# Patient Record
Sex: Female | Born: 1950 | Race: White | Hispanic: No | Marital: Married
Health system: Southern US, Community
[De-identification: ages and names within clinical notes are randomized; demographics above are authoritative.]

---

## 2018-04-15 ENCOUNTER — Inpatient Hospital Stay
Admission: AD | Admit: 2018-04-15 | Discharge: 2018-05-10 | Disposition: A | Discharge status: Home / Self Care | Payer: Medicaid Other | Source: Ambulatory Visit | Attending: Internal Medicine | Admitting: Internal Medicine

## 2018-04-15 ENCOUNTER — Other Ambulatory Visit (HOSPITAL_COMMUNITY): Payer: Medicaid Other

## 2018-04-15 DIAGNOSIS — K567 Ileus, unspecified: Secondary | ICD-10-CM

## 2018-04-15 DIAGNOSIS — L0291 Cutaneous abscess, unspecified: Secondary | ICD-10-CM

## 2018-04-15 DIAGNOSIS — R0602 Shortness of breath: Secondary | ICD-10-CM

## 2018-04-15 DIAGNOSIS — Z4659 Encounter for fitting and adjustment of other gastrointestinal appliance and device: Secondary | ICD-10-CM

## 2018-04-15 LAB — C DIFFICILE QUICK SCREEN W PCR REFLEX
C DIFFICLE (CDIFF) ANTIGEN: NEGATIVE
C Diff interpretation: NOT DETECTED
C Diff toxin: NEGATIVE

## 2018-04-16 LAB — COMPREHENSIVE METABOLIC PANEL
ALT: 23 U/L (ref 0–44)
AST: 35 U/L (ref 15–41)
Albumin: 1.3 g/dL — ABNORMAL LOW (ref 3.5–5.0)
Alkaline Phosphatase: 119 U/L (ref 38–126)
Anion gap: 9 (ref 5–15)
BUN: 9 mg/dL (ref 8–23)
CHLORIDE: 102 mmol/L (ref 98–111)
CO2: 27 mmol/L (ref 22–32)
CREATININE: 0.65 mg/dL (ref 0.44–1.00)
Calcium: 7.5 mg/dL — ABNORMAL LOW (ref 8.9–10.3)
GFR calc Af Amer: >60 mL/min (ref >60)
GFR calc non Af Amer: >60 mL/min (ref >60)
Glucose, Bld: 130 mg/dL — ABNORMAL HIGH (ref 70–99)
Potassium: 2.9 mmol/L — ABNORMAL LOW (ref 3.5–5.1)
Sodium: 138 mmol/L (ref 135–145)
Total Bilirubin: 0.4 mg/dL (ref 0.3–1.2)
Total Protein: 5.3 g/dL — ABNORMAL LOW (ref 6.5–8.1)

## 2018-04-16 LAB — CBC WITH DIFFERENTIAL/PLATELET
Abs Immature Granulocytes: 0.06 10*3/uL (ref 0.00–0.07)
Basophils Absolute: 0 10*3/uL (ref 0.0–0.1)
Basophils Relative: 0 %
Eosinophils Absolute: 0 10*3/uL (ref 0.0–0.5)
Eosinophils Relative: 1 %
HCT: 29 % — ABNORMAL LOW (ref 36.0–46.0)
Hemoglobin: 8.7 g/dL — ABNORMAL LOW (ref 12.0–15.0)
Immature Granulocytes: 1 %
Lymphocytes Relative: 33 %
Lymphs Abs: 2.9 10*3/uL (ref 0.7–4.0)
MCH: 28 pg (ref 26.0–34.0)
MCHC: 30 g/dL (ref 30.0–36.0)
MCV: 93.2 fL (ref 80.0–100.0)
Monocytes Absolute: 1.1 10*3/uL — ABNORMAL HIGH (ref 0.1–1.0)
Monocytes Relative: 13 %
Neutro Abs: 4.7 10*3/uL (ref 1.7–7.7)
Neutrophils Relative %: 52 %
Platelets: 185 10*3/uL (ref 150–400)
RBC: 3.11 MIL/uL — ABNORMAL LOW (ref 3.87–5.11)
RDW: 17.2 % — ABNORMAL HIGH (ref 11.5–15.5)
WBC: 8.9 10*3/uL (ref 4.0–10.5)
nRBC: 0 % (ref 0.0–0.2)

## 2018-04-16 LAB — HEMOGLOBIN A1C
Hgb A1c MFr Bld: 5.4 % (ref 4.8–5.6)
Mean Plasma Glucose: 108.28 mg/dL

## 2018-04-16 LAB — MAGNESIUM: Magnesium: 1.7 mg/dL (ref 1.7–2.4)

## 2018-04-16 MED ORDER — CLONAZEPAM 1 MG PO TABS
1.00 | ORAL_TABLET | ORAL | Status: DC
Start: ? — End: 2018-04-16

## 2018-04-16 MED ORDER — ENOXAPARIN SODIUM 40 MG/0.4ML ~~LOC~~ SOLN
.60 | SUBCUTANEOUS | Status: DC
Start: 2018-04-15 — End: 2018-04-16

## 2018-04-16 MED ORDER — THERA-M PO TABS
1.00 | ORAL_TABLET | ORAL | Status: DC
Start: 2018-04-16 — End: 2018-04-16

## 2018-04-16 MED ORDER — BUDESONIDE-FORMOTEROL FUMARATE 80-4.5 MCG/ACT IN AERO
2.00 | INHALATION_SPRAY | RESPIRATORY_TRACT | Status: DC
Start: 2018-04-15 — End: 2018-04-16

## 2018-04-16 MED ORDER — FLUOXETINE HCL 40 MG PO CAPS
80.00 | ORAL_CAPSULE | ORAL | Status: DC
Start: 2018-04-16 — End: 2018-04-16

## 2018-04-16 MED ORDER — GENERIC EXTERNAL MEDICATION
Status: DC
Start: 2018-04-15 — End: 2018-04-16

## 2018-04-16 MED ORDER — GABAPENTIN 300 MG PO CAPS
300.00 | ORAL_CAPSULE | ORAL | Status: DC
Start: 2018-04-15 — End: 2018-04-16

## 2018-04-16 MED ORDER — GENERIC EXTERNAL MEDICATION
500.00 | Status: DC
Start: ? — End: 2018-04-16

## 2018-04-16 MED ORDER — PANTOPRAZOLE SODIUM 40 MG PO TBEC
40.00 | DELAYED_RELEASE_TABLET | ORAL | Status: DC
Start: ? — End: 2018-04-16

## 2018-04-16 MED ORDER — PANTOPRAZOLE SODIUM 40 MG PO TBEC
40.00 | DELAYED_RELEASE_TABLET | ORAL | Status: DC
Start: 2018-04-15 — End: 2018-04-16

## 2018-04-16 MED ORDER — THIAMINE HCL 100 MG PO TABS
100.00 | ORAL_TABLET | ORAL | Status: DC
Start: 2018-04-16 — End: 2018-04-16

## 2018-04-16 MED ORDER — ATORVASTATIN CALCIUM 40 MG PO TABS
80.00 | ORAL_TABLET | ORAL | Status: DC
Start: 2018-04-16 — End: 2018-04-16

## 2018-04-16 MED ORDER — ACETAMINOPHEN 500 MG PO TABS
1000.00 | ORAL_TABLET | ORAL | Status: DC
Start: ? — End: 2018-04-16

## 2018-04-16 MED ORDER — METOCLOPRAMIDE HCL 5 MG/ML IJ SOLN
10.00 | INTRAMUSCULAR | Status: DC
Start: 2018-04-15 — End: 2018-04-16

## 2018-04-16 MED ORDER — BACLOFEN 10 MG PO TABS
10.00 | ORAL_TABLET | ORAL | Status: DC
Start: 2018-04-15 — End: 2018-04-16

## 2018-04-16 MED ORDER — ROPINIROLE HCL 0.5 MG PO TABS
0.50 | ORAL_TABLET | ORAL | Status: DC
Start: 2018-04-15 — End: 2018-04-16

## 2018-04-16 MED ORDER — ONDANSETRON HCL 4 MG/2ML IJ SOLN
4.00 | INTRAMUSCULAR | Status: DC
Start: ? — End: 2018-04-16

## 2018-04-16 MED ORDER — TOPIRAMATE 25 MG PO TABS
25.00 | ORAL_TABLET | ORAL | Status: DC
Start: 2018-04-15 — End: 2018-04-16

## 2018-04-16 MED ORDER — MELATONIN 3 MG PO TABS
3.00 | ORAL_TABLET | ORAL | Status: DC
Start: 2018-04-15 — End: 2018-04-16

## 2018-04-16 MED ORDER — GENERIC EXTERNAL MEDICATION
12.50 | Status: DC
Start: ? — End: 2018-04-16

## 2018-04-16 MED ORDER — AMIODARONE HCL 200 MG PO TABS
200.00 | ORAL_TABLET | ORAL | Status: DC
Start: 2018-04-16 — End: 2018-04-16

## 2018-04-17 LAB — POTASSIUM: Potassium: 3.4 mmol/L — ABNORMAL LOW (ref 3.5–5.1)

## 2018-04-19 LAB — BASIC METABOLIC PANEL
Anion gap: 12 (ref 5–15)
BUN: 7 mg/dL — ABNORMAL LOW (ref 8–23)
CHLORIDE: 101 mmol/L (ref 98–111)
CO2: 23 mmol/L (ref 22–32)
Calcium: 7.8 mg/dL — ABNORMAL LOW (ref 8.9–10.3)
Creatinine, Ser: 0.66 mg/dL (ref 0.44–1.00)
GFR calc Af Amer: >60 mL/min (ref >60)
GFR calc non Af Amer: >60 mL/min (ref >60)
Glucose, Bld: 102 mg/dL — ABNORMAL HIGH (ref 70–99)
Potassium: 3.4 mmol/L — ABNORMAL LOW (ref 3.5–5.1)
Sodium: 136 mmol/L (ref 135–145)

## 2018-04-22 LAB — BASIC METABOLIC PANEL
Anion gap: 10 (ref 5–15)
BUN: 10 mg/dL (ref 8–23)
CALCIUM: 7.9 mg/dL — AB (ref 8.9–10.3)
CHLORIDE: 102 mmol/L (ref 98–111)
CO2: 26 mmol/L (ref 22–32)
CREATININE: 0.63 mg/dL (ref 0.44–1.00)
GFR calc Af Amer: >60 mL/min (ref >60)
GFR calc non Af Amer: >60 mL/min (ref >60)
Glucose, Bld: 91 mg/dL (ref 70–99)
Potassium: 2.9 mmol/L — ABNORMAL LOW (ref 3.5–5.1)
SODIUM: 138 mmol/L (ref 135–145)

## 2018-04-23 LAB — POTASSIUM: Potassium: 3.8 mmol/L (ref 3.5–5.1)

## 2018-04-24 ENCOUNTER — Other Ambulatory Visit (HOSPITAL_COMMUNITY): Payer: Medicaid Other

## 2018-04-26 LAB — BASIC METABOLIC PANEL
Anion gap: 10 (ref 5–15)
BUN: 7 mg/dL — ABNORMAL LOW (ref 8–23)
CO2: 25 mmol/L (ref 22–32)
Calcium: 8 mg/dL — ABNORMAL LOW (ref 8.9–10.3)
Chloride: 104 mmol/L (ref 98–111)
Creatinine, Ser: 0.66 mg/dL (ref 0.44–1.00)
GFR calc Af Amer: >60 mL/min (ref >60)
Glucose, Bld: 84 mg/dL (ref 70–99)
POTASSIUM: 3.3 mmol/L — AB (ref 3.5–5.1)
Sodium: 139 mmol/L (ref 135–145)

## 2018-04-26 LAB — CBC
HCT: 31.8 % — ABNORMAL LOW (ref 36.0–46.0)
Hemoglobin: 9.1 g/dL — ABNORMAL LOW (ref 12.0–15.0)
MCH: 27.6 pg (ref 26.0–34.0)
MCHC: 28.6 g/dL — ABNORMAL LOW (ref 30.0–36.0)
MCV: 96.4 fL (ref 80.0–100.0)
Platelets: 222 10*3/uL (ref 150–400)
RBC: 3.3 MIL/uL — ABNORMAL LOW (ref 3.87–5.11)
RDW: 18 % — ABNORMAL HIGH (ref 11.5–15.5)
WBC: 7.2 10*3/uL (ref 4.0–10.5)
nRBC: 0 % (ref 0.0–0.2)

## 2018-04-26 LAB — C DIFFICILE QUICK SCREEN W PCR REFLEX
C Diff antigen: NEGATIVE
C Diff interpretation: NOT DETECTED
C Diff toxin: NEGATIVE

## 2018-04-27 ENCOUNTER — Institutional Professional Consult (permissible substitution) (HOSPITAL_COMMUNITY): Payer: Medicaid Other

## 2018-04-27 MED ORDER — IOHEXOL 300 MG/ML  SOLN
100.0000 mL | Freq: Once | INTRAMUSCULAR | Status: AC | PRN
  Administered 2018-04-27: 100 mL via INTRAVENOUS

## 2018-04-29 NOTE — Progress Notes (Signed)
Patient ID: Mackenzie HittLavonne B Horne, female   DOB: [birth year], 67 y.o.   MRN: [id number]   Request made for IR to evaluate for possible abscess drain placement  Pt with Nausea and Abd pain CT Yesterday:  IMPRESSION: 1. Patient with outside imaging, not available for direct review. Appropriate clinical history also not available. 2. Peripherally enhancing fluid collections in the left subdiaphragmatic region and both rectus sheaths, sterility indeterminate. 3. Irregular gastric wall thickening. Irregular fluid either within the gastric wall or immediately adjacent to the gastric fundus. 4. Small subcapsular fluid collection adjacent to the right lobe of the liver measuring up to 1 cm in depth, sterility indeterminate. 5. Heterogeneous central mesenteric mass with additional enhancing nodules at the root of the small bowel mesentery, patient with reported history of neuroendocrine lesions. Additional enhancing lesions noted in the pelvis. 6. 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 Small bowel wall thickening involving a lower abdominopelvic bowel loops may be due to venous congestion. Similar bowel wall thickening seen at the transverse colon. 7. Moderate left pleural effusion with adjacent compressive atelectasis. 8. Possible enhancing liver lesions, not well assessed due to phase of contrast. 9.  Aortic Atherosclerosis (ICD10-I70.0).  Dr Lowella DandyHenn has reviewed imaging Notes are several very small collections throughout abdomen and abd wall Presumably post op NO collection is large enough for asp/drain placement Pt with wbc wnl Rec: watch for signs of sepsis Consider treatment of collections with antibiotic coverage Rescan in 5-7 days if symptoms continue or worsen   Dr Manson PasseyBrown aware

## 2018-04-30 ENCOUNTER — Other Ambulatory Visit (HOSPITAL_COMMUNITY): Payer: Medicaid Other

## 2018-05-03 LAB — COMPREHENSIVE METABOLIC PANEL
ALT: 15 U/L (ref 0–44)
AST: 28 U/L (ref 15–41)
Albumin: 1.6 g/dL — ABNORMAL LOW (ref 3.5–5.0)
Alkaline Phosphatase: 113 U/L (ref 38–126)
Anion gap: 9 (ref 5–15)
BUN: 5 mg/dL — AB (ref 8–23)
CALCIUM: 7.8 mg/dL — AB (ref 8.9–10.3)
CO2: 25 mmol/L (ref 22–32)
CREATININE: 0.7 mg/dL (ref 0.44–1.00)
Chloride: 104 mmol/L (ref 98–111)
GFR calc Af Amer: >60 mL/min (ref >60)
GFR calc non Af Amer: >60 mL/min (ref >60)
Glucose, Bld: 86 mg/dL (ref 70–99)
Potassium: 2.5 mmol/L — CL (ref 3.5–5.1)
Sodium: 138 mmol/L (ref 135–145)
Total Bilirubin: 0.5 mg/dL (ref 0.3–1.2)
Total Protein: 5.2 g/dL — ABNORMAL LOW (ref 6.5–8.1)

## 2018-05-03 LAB — CBC
HCT: 29.4 % — ABNORMAL LOW (ref 36.0–46.0)
HEMOGLOBIN: 9.1 g/dL — AB (ref 12.0–15.0)
MCH: 29.4 pg (ref 26.0–34.0)
MCHC: 31 g/dL (ref 30.0–36.0)
MCV: 95.1 fL (ref 80.0–100.0)
NRBC: 0 % (ref 0.0–0.2)
Platelets: 217 10*3/uL (ref 150–400)
RBC: 3.09 MIL/uL — ABNORMAL LOW (ref 3.87–5.11)
RDW: 18.8 % — ABNORMAL HIGH (ref 11.5–15.5)
WBC: 6.7 10*3/uL (ref 4.0–10.5)

## 2018-05-03 LAB — POTASSIUM: Potassium: 4.3 mmol/L (ref 3.5–5.1)

## 2018-05-03 LAB — MAGNESIUM: Magnesium: 1.6 mg/dL — ABNORMAL LOW (ref 1.7–2.4)

## 2018-05-04 LAB — BASIC METABOLIC PANEL
Anion gap: 9 (ref 5–15)
BUN: <5 mg/dL — ABNORMAL LOW (ref 8–23)
CO2: 24 mmol/L (ref 22–32)
Calcium: 8 mg/dL — ABNORMAL LOW (ref 8.9–10.3)
Chloride: 102 mmol/L (ref 98–111)
Creatinine, Ser: 0.73 mg/dL (ref 0.44–1.00)
Glucose, Bld: 78 mg/dL (ref 70–99)
Potassium: 4.2 mmol/L (ref 3.5–5.1)
Sodium: 135 mmol/L (ref 135–145)

## 2018-05-04 LAB — MAGNESIUM: Magnesium: 2 mg/dL (ref 1.7–2.4)

## 2019-01-11 DEATH — deceased

## 2020-03-17 IMAGING — DX DG ABD PORTABLE 1V
1 series · 1 of 1 positions shown · non-contrast
Comparison: No prior.

CLINICAL DATA: Nasogastric tube placement.

EXAM:
PORTABLE ABDOMEN - 1 VIEW

[abdomen kub]
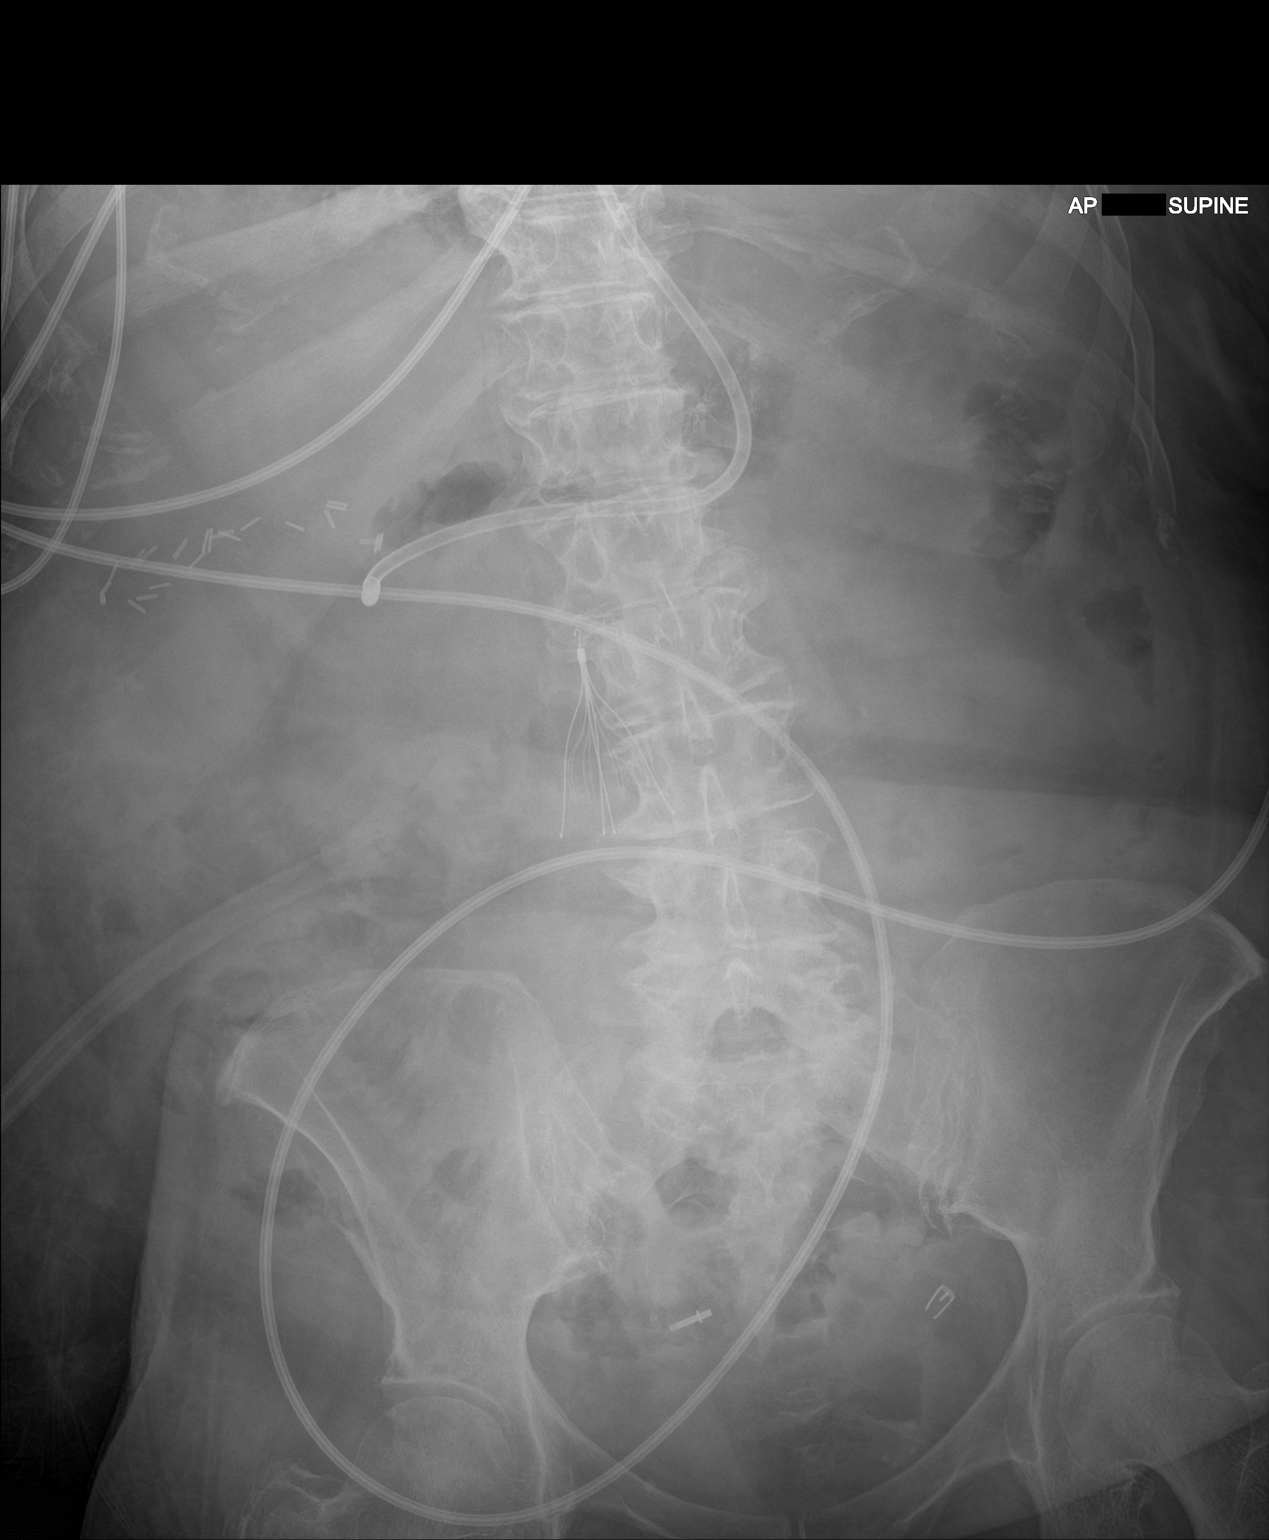

[1 of 1 positions shown; findings below may reference images not displayed]

FINDINGS: Nasogastric tube noted with tip over the distal stomach. Surgical
tubing noted over the right lower abdomen. Surgical clips right
upper quadrant. IVC filter noted with tip at the L1-L2 disc space
level. Bilateral tubal ligations. No bowel distention. Several
nondilated air-filled loops of small bowel noted. This is
nonspecific. No free air. Degenerative changes lumbar spine and both
hips.
IMPRESSION: 1.  NG tube noted with tip over the distal stomach.

2. Postsurgical changes as above. Several air-filled loops of
nondilated small bowel noted. This is nonspecific finding. No bowel
distention or free air noted.

## 2020-03-29 IMAGING — CT CT ABD-PELV W/ CM
2 of 5 series · 14 of 46 positions shown, 16 images · IV contrast (APPLIED)
Comparison: Radiograph 03/25/2018.

CLINICAL DATA: Unspecified abdominal pain. No additional history
provided.

EXAM:
CT ABDOMEN AND PELVIS WITH CONTRAST
TECHNIQUE: Multidetector CT imaging of the abdomen and pelvis was performed
using the standard protocol following bolus administration of
intravenous contrast.
CONTRAST:  100mL OMNIPAQUE IOHEXOL 300 MG/ML  SOLN

[Series 3: abd/ pelvis 5.0 i30f 2 · axial · 0.91mm/px · z∈[-638,-214]mm · 11 of 96 slices shown, 13 images]
[im 6/96  soft-tissue]
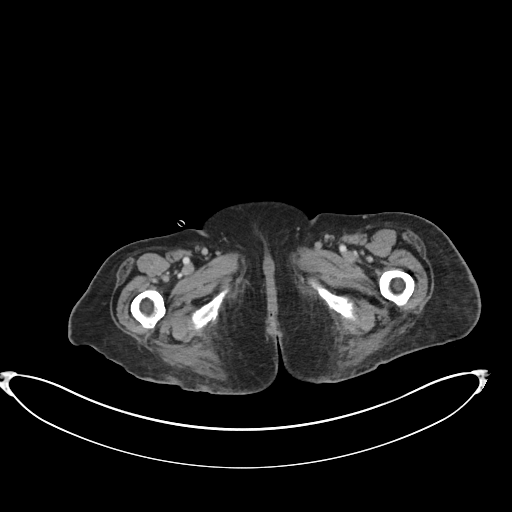
[im 6/96  bone]
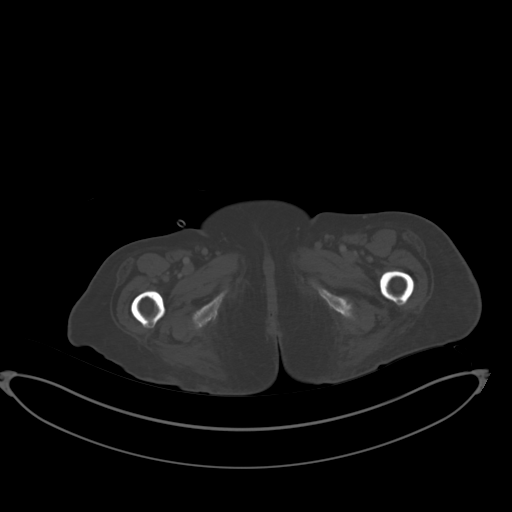
[im 16/96  soft-tissue]
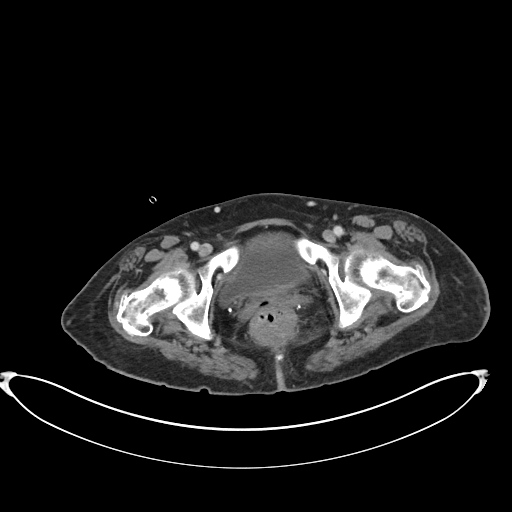
[im 26/96  soft-tissue]
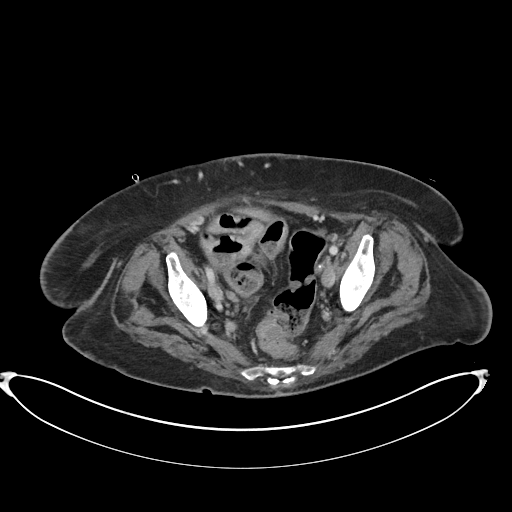
[im 31/96  soft-tissue]
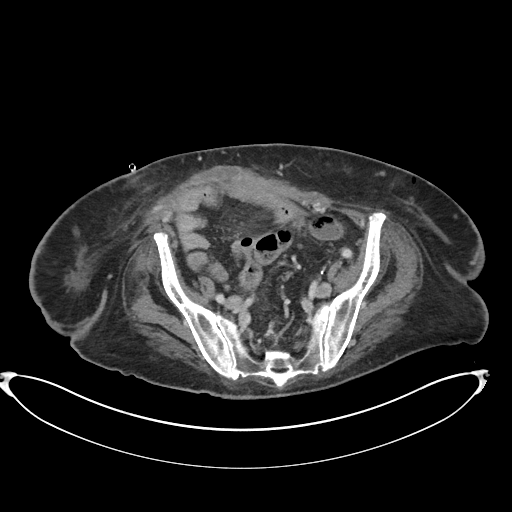
[im 41/96  soft-tissue]
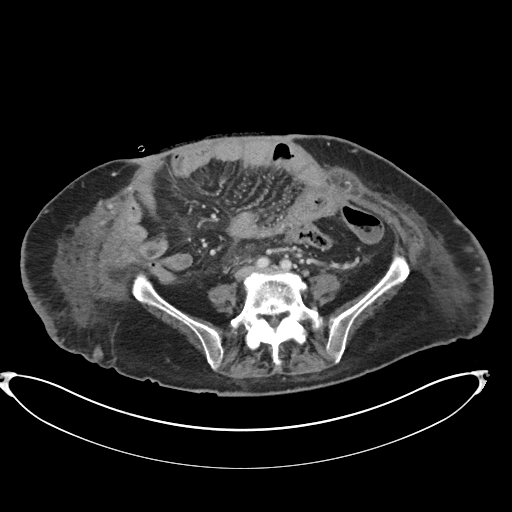
[im 51/96  soft-tissue]
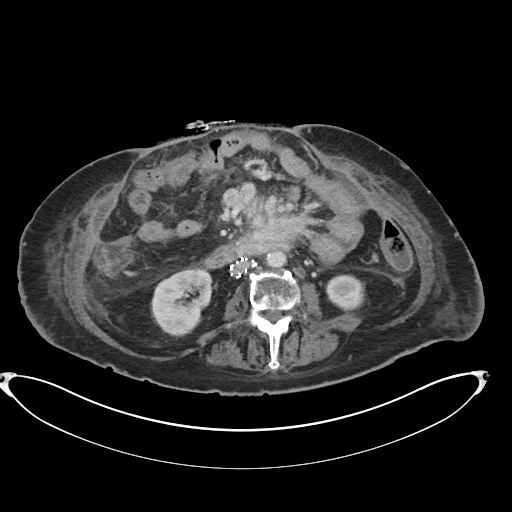
[im 56/96  soft-tissue]
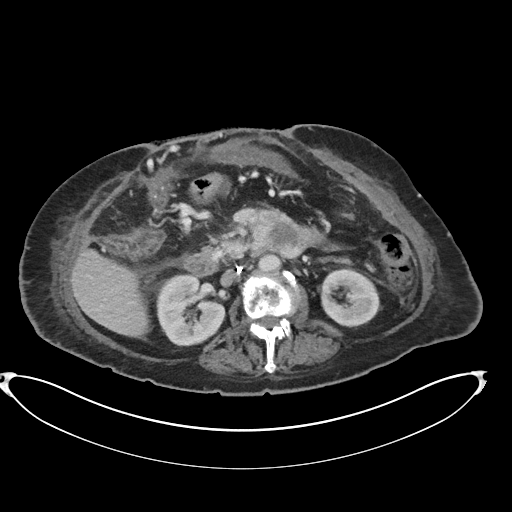
[im 66/96  soft-tissue]
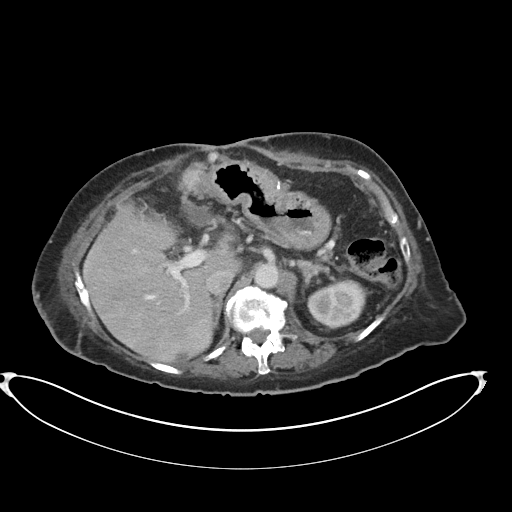
[im 71/96  soft-tissue]
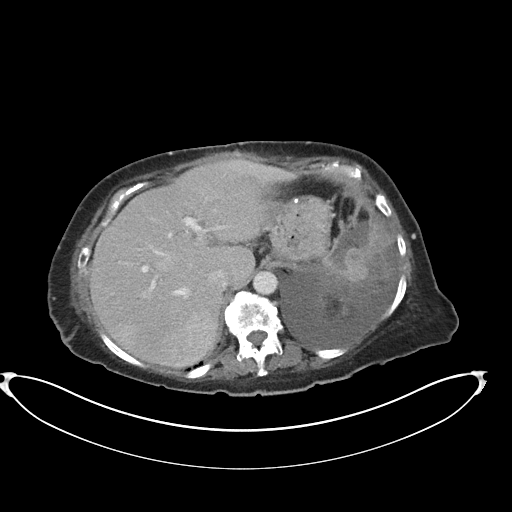
[im 71/96  bone]
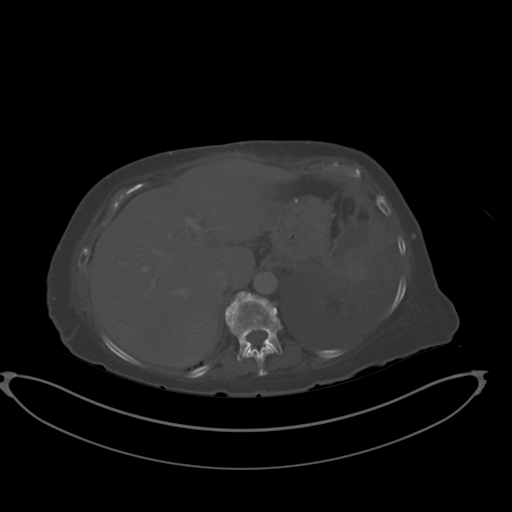
[im 81/96  soft-tissue]
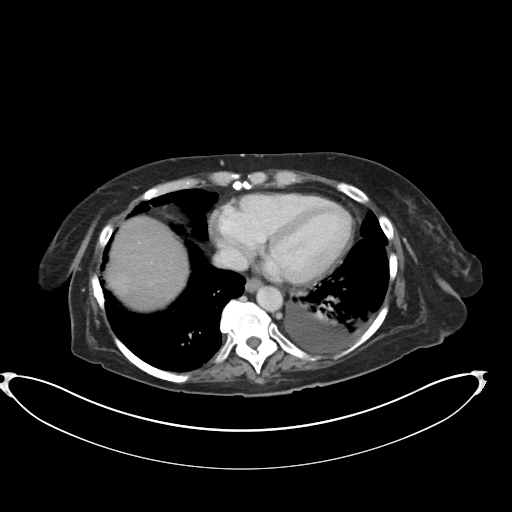
[im 91/96  soft-tissue]
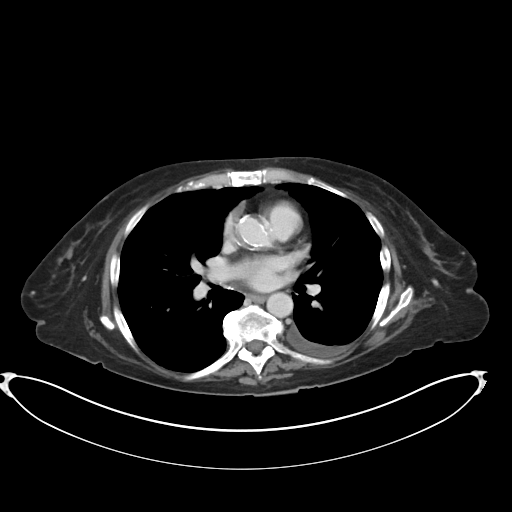

[Series 6: coronal soft tissue · coronal · 0.88mm/px · 3 of 85 slices shown]
[im 29/85  soft-tissue]
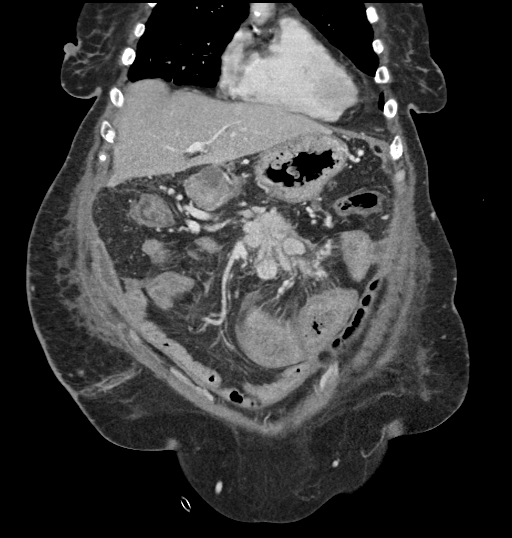
[im 38/85  soft-tissue]
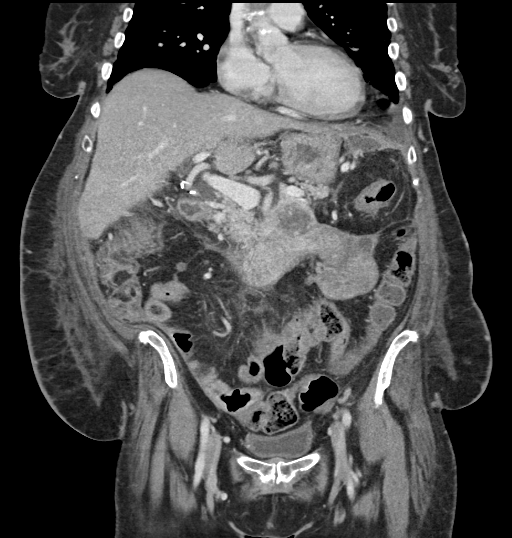
[im 47/85  soft-tissue]
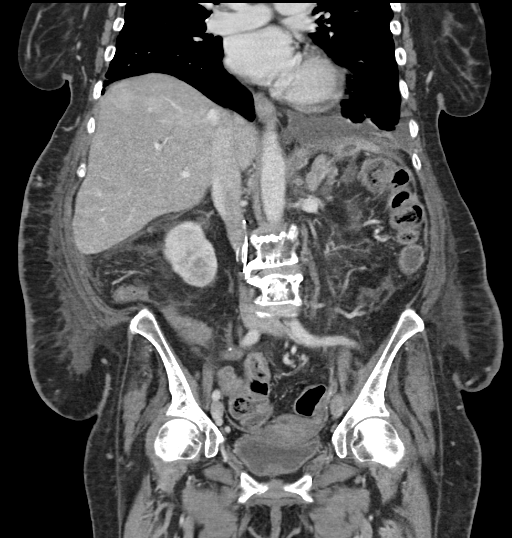

[14 of 46 positions shown; findings below may reference images not displayed]

Report from chest, abdomen, and
pelvis CT 03/21/2018 from [email or web address] Those
images are not available.
FINDINGS: Lower chest: Moderate left pleural effusion with compressive
atelectasis. Patchy ground-glass and reticulonodular opacities in
the right lower and middle lobe. Punctate calcified granuloma right
lower lobe.

Hepatobiliary: Heterogeneous attenuation to the liver with possible
areas of hypervascular lesions particularly in the dome, for example
enhancing focus measuring 18 mm image 19 series 3. Small subcapsular
fluid collection about the inferior medial right lobe measures up to
1 cm, sterility indeterminate. Prior cholecystectomy.

Pancreas: Parenchymal atrophy.  No acute inflammation.

Spleen: Absent.

Adrenals/Urinary Tract: Bilateral adrenal thickening without
dominant nodule. No hydronephrosis or perinephric edema. Homogeneous
renal enhancement with symmetric excretion on delayed phase imaging.
Tiny low-density lesions throughout both kidneys are too small to
characterize. Urinary bladder is partially distended without wall
thickening.

Stomach/Bowel: Enteric suture line in the stomach, irregular gastric
wall thickening distally. Low-density fluid collection either within
or urine to the gastric fundus, images 32-33 series 3. Slight
duodenal retraction secondary to central mesenteric mass. Wall
thickening with perienteric edema involving small bowel in the
central abdomen that is opposed to the open anterior abdominal wall,
probable adhesions. There is colonic wall thickening of the
transverse colon.

Vascular/Lymphatic: IVC filter in place. Superior mesenteric vein
may be occluded due to central mesenteric mass, there are numerous
mesenteric collaterals. Mild aorta bi-iliac atherosclerosis without
aneurysm.

Reproductive: Uterus and adnexa are unremarkable. Bilateral tubal
ligation clips.

Other: Heterogeneous central mesenteric mass measures approximately
2.9 x 3.3 cm and abuts the superior mesenteric artery. Additional
small mesenteric nodules extend into the root of the small bowel
mesentery. There is an enhancing nodule in the right pelvis
measuring 11 mm, image 74 series 3. Tiny enhancing focus in the mid
pelvis image 72 series 3, exact location difficult to delineate.
Mesenteric edema and free fluid. There is a peripherally enhancing
left subdiaphragmatic fluid collection that is irregular in shape
measuring 5.2 x 2.3 x 4.4 cm, no definite internal air. No
pneumoperitoneum or free air. Open anterior abdominal wall with
overlying dressing in place.

Musculoskeletal: Peripherally enhancing fluid collection in the
right rectus sheath measures 3.3 x 1.3 x 4.4 cm. Fluid collection in
the left rectus sheath demonstrate lesser peripheral enhancement and
measures 4.4 x 1.2 x 4.3 cm. Subacute fractures of the sternum and
bilateral ribs. Multilevel degenerative change in the lumbar spine.
IMPRESSION: 1. Patient with outside imaging, not available for direct review.
Appropriate clinical history also not available.
2. Peripherally enhancing fluid collections in the left
subdiaphragmatic region and both rectus sheaths, sterility
indeterminate.
3. Irregular gastric wall thickening. Irregular fluid either within
the gastric wall or immediately adjacent to the gastric fundus.
4. Small subcapsular fluid collection adjacent to the right lobe of
the liver measuring up to 1 cm in depth, sterility indeterminate.
5. Heterogeneous central mesenteric mass with additional enhancing
nodules at the root of the small bowel mesentery, patient with
reported history of neuroendocrine lesions. Additional enhancing
lesions noted in the pelvis.
6. Small bowel mass abuts the superior mesenteric artery, and may
obstruct the superior mesenteric vein with multiple mesenteric
collaterals. Small bowel wall thickening involving a lower
abdominopelvic bowel loops may be due to venous congestion. Similar
bowel wall thickening seen at the transverse colon.
7. Moderate left pleural effusion with adjacent compressive
atelectasis.
8. Possible enhancing liver lesions, not well assessed due to phase
of contrast.
9.  Aortic Atherosclerosis ([id number]).

## 2020-04-01 IMAGING — DX DG ABD PORTABLE 1V
1 series · 1 of 1 positions shown · non-contrast
Comparison: CT 04/27/2018, radiograph 04/24/2018

CLINICAL DATA: Ileus

EXAM:
PORTABLE ABDOMEN - 1 VIEW

[abdomen]
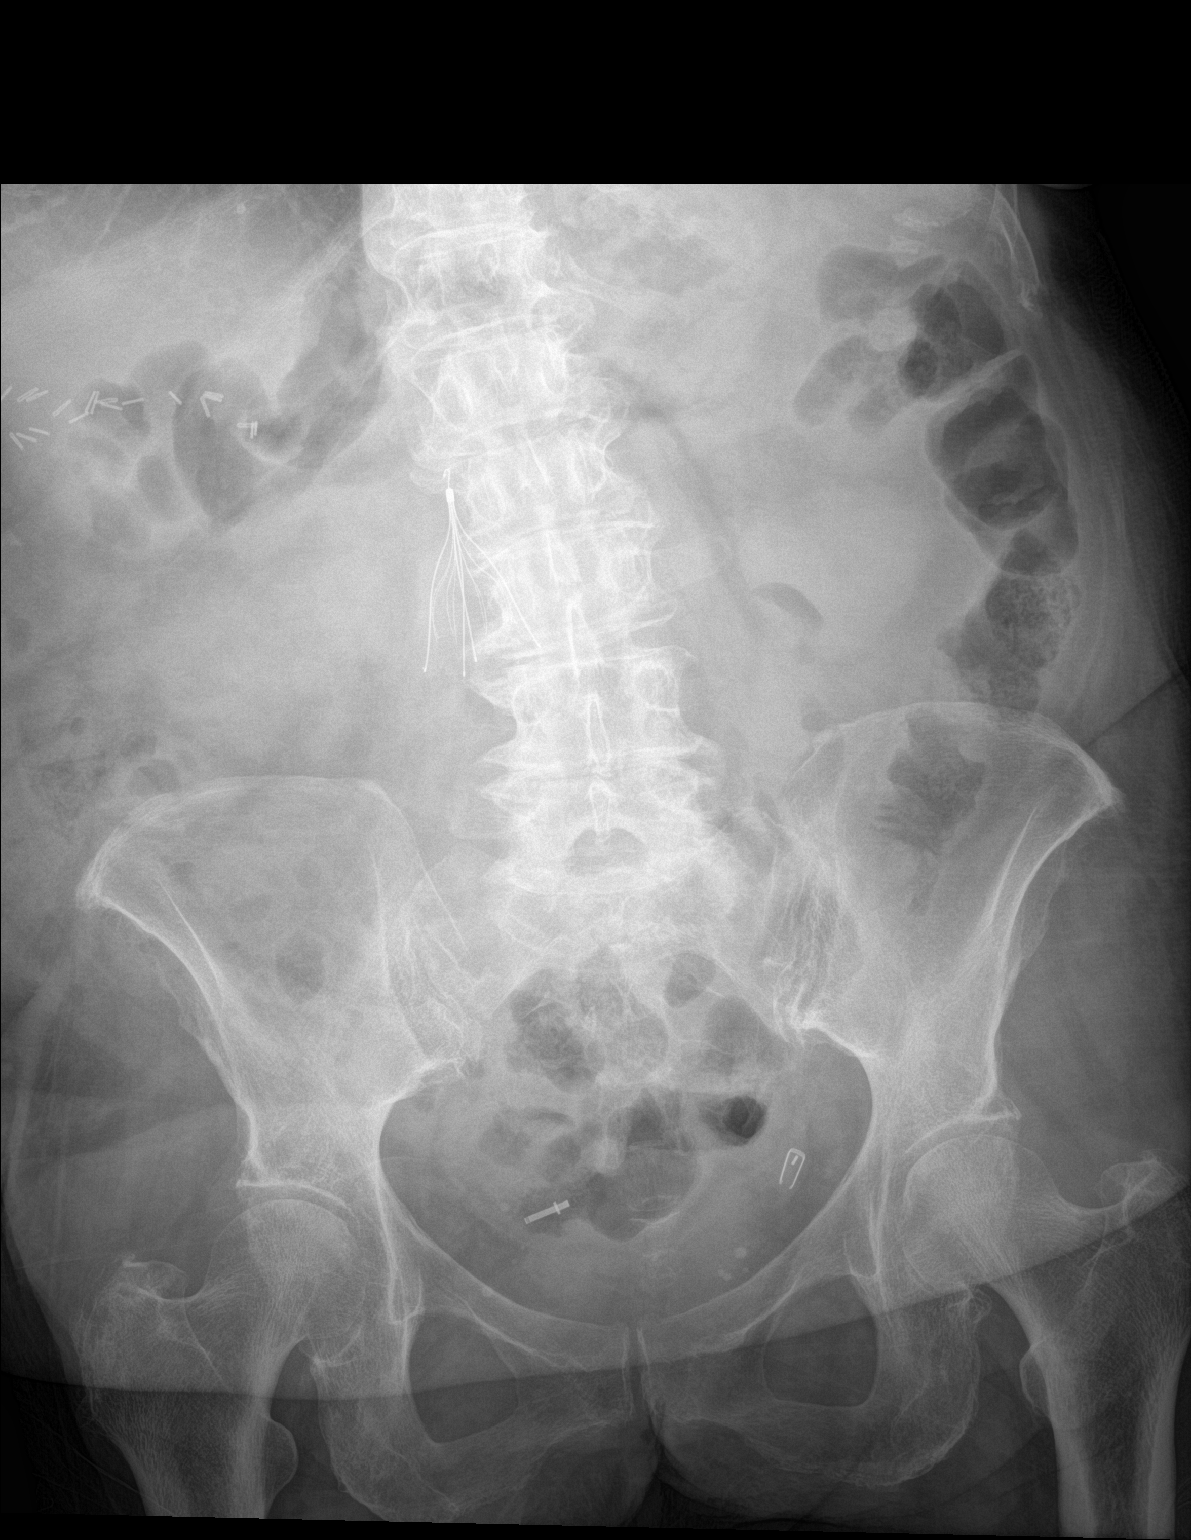

[1 of 1 positions shown; findings below may reference images not displayed]

FINDINGS: Surgical clips in the right upper quadrant. IVC filter to the right
of L2 and L3. Nonobstructed gas pattern. Ligation clips in the
pelvis.
IMPRESSION: Nonobstructed bowel-gas pattern
